# Patient Record
Sex: Male | Born: 1955 | Race: White | Hispanic: No | Marital: Married | Smoking: Never smoker
Health system: Southern US, Community
[De-identification: ages and names within clinical notes are randomized; demographics above are authoritative.]

## PROBLEM LIST (undated history)

## (undated) DIAGNOSIS — I10 Essential (primary) hypertension: Secondary | ICD-10-CM

## (undated) DIAGNOSIS — J45909 Unspecified asthma, uncomplicated: Secondary | ICD-10-CM

## (undated) DIAGNOSIS — E119 Type 2 diabetes mellitus without complications: Secondary | ICD-10-CM

## (undated) DIAGNOSIS — E559 Vitamin D deficiency, unspecified: Secondary | ICD-10-CM

## (undated) DIAGNOSIS — E785 Hyperlipidemia, unspecified: Secondary | ICD-10-CM

## (undated) HISTORY — DX: Vitamin D deficiency, unspecified: E55.9

## (undated) HISTORY — DX: Type 2 diabetes mellitus without complications: E11.9

## (undated) HISTORY — DX: Essential (primary) hypertension: I10

## (undated) HISTORY — DX: Hyperlipidemia, unspecified: E78.5

## (undated) HISTORY — DX: Unspecified asthma, uncomplicated: J45.909

---

## 2006-05-08 ENCOUNTER — Ambulatory Visit (HOSPITAL_COMMUNITY): Admission: RE | Admit: 2006-05-08 | Discharge: 2006-05-09 | Payer: Self-pay | Admitting: Orthopedic Surgery

## 2006-06-06 ENCOUNTER — Encounter: Admission: RE | Admit: 2006-06-06 | Discharge: 2006-09-04 | Payer: Self-pay | Admitting: Orthopedic Surgery

## 2006-09-05 ENCOUNTER — Encounter: Admission: RE | Admit: 2006-09-05 | Discharge: 2006-10-03 | Payer: Self-pay | Admitting: Orthopedic Surgery

## 2007-06-30 ENCOUNTER — Ambulatory Visit: Payer: Self-pay | Admitting: Internal Medicine

## 2007-07-14 ENCOUNTER — Ambulatory Visit: Payer: Self-pay | Admitting: Internal Medicine

## 2008-07-08 ENCOUNTER — Encounter (INDEPENDENT_AMBULATORY_CARE_PROVIDER_SITE_OTHER): Payer: Self-pay | Admitting: *Deleted

## 2008-08-31 ENCOUNTER — Encounter: Admission: RE | Admit: 2008-08-31 | Discharge: 2008-08-31 | Payer: Self-pay | Admitting: Neurosurgery

## 2009-04-11 ENCOUNTER — Ambulatory Visit (HOSPITAL_COMMUNITY): Admission: RE | Admit: 2009-04-11 | Discharge: 2009-04-11 | Payer: Self-pay | Admitting: Neurosurgery

## 2009-05-04 ENCOUNTER — Telehealth: Payer: Self-pay | Admitting: Internal Medicine

## 2009-06-02 ENCOUNTER — Encounter: Admission: RE | Admit: 2009-06-02 | Discharge: 2009-06-02 | Payer: Self-pay | Admitting: Neurosurgery

## 2009-08-15 ENCOUNTER — Encounter
Admission: RE | Admit: 2009-08-15 | Discharge: 2009-11-13 | Payer: Self-pay | Source: Home / Self Care | Admitting: Neurosurgery

## 2010-04-13 NOTE — Progress Notes (Signed)
Summary: Schedule Colonoscopy  Phone Note Outgoing Call Call back at [facility] Phone [phone]   Call placed by: Harlow Mares CMA Duncan Dull),  May 04, 2009 3:37 PM Call placed to: Patient Summary of Call: Left message on patients machine to call back.  Initial call taken by: Harlow Mares CMA Duncan Dull),  May 04, 2009 3:37 PM  Follow-up for Phone Call        patinet just has 5 disk repaired in his neck and he will call back when he is ready to schedule his colonoscopy Follow-up by: Harlow Mares CMA Duncan Dull),  May 17, 2009 4:10 PM

## 2010-05-29 LAB — GLUCOSE, CAPILLARY: Glucose-Capillary: 191 mg/dL — ABNORMAL HIGH (ref 70–99)

## 2010-05-29 LAB — BASIC METABOLIC PANEL
BUN: 13 mg/dL (ref 6–23)
Creatinine, Ser: 1.11 mg/dL (ref 0.4–1.5)
GFR calc non Af Amer: >60 mL/min (ref >60)
Glucose, Bld: 132 mg/dL — ABNORMAL HIGH (ref 70–99)
Potassium: 4.7 mEq/L (ref 3.5–5.1)

## 2010-05-29 LAB — CBC
Platelets: 221 10*3/uL (ref 150–400)
RDW: 12.9 % (ref 11.5–15.5)
WBC: 8.3 10*3/uL (ref 4.0–10.5)

## 2010-05-29 LAB — DIFFERENTIAL
Basophils Absolute: 0.1 10*3/uL (ref 0.0–0.1)
Basophils Relative: 1 % (ref 0–1)
Eosinophils Absolute: 0.1 10*3/uL (ref 0.0–0.7)
Eosinophils Relative: 2 % (ref 0–5)
Lymphocytes Relative: 32 % (ref 12–46)
Lymphs Abs: 2.7 10*3/uL (ref 0.7–4.0)
Monocytes Absolute: 0.6 10*3/uL (ref 0.1–1.0)

## 2010-06-15 ENCOUNTER — Encounter: Payer: Self-pay | Admitting: Nurse Practitioner

## 2010-06-15 DIAGNOSIS — J449 Chronic obstructive pulmonary disease, unspecified: Secondary | ICD-10-CM

## 2010-06-15 DIAGNOSIS — E119 Type 2 diabetes mellitus without complications: Secondary | ICD-10-CM | POA: Insufficient documentation

## 2010-06-15 DIAGNOSIS — I10 Essential (primary) hypertension: Secondary | ICD-10-CM | POA: Insufficient documentation

## 2010-06-15 DIAGNOSIS — E785 Hyperlipidemia, unspecified: Secondary | ICD-10-CM | POA: Insufficient documentation

## 2010-06-15 DIAGNOSIS — J4489 Other specified chronic obstructive pulmonary disease: Secondary | ICD-10-CM | POA: Insufficient documentation

## 2010-06-15 DIAGNOSIS — E559 Vitamin D deficiency, unspecified: Secondary | ICD-10-CM | POA: Insufficient documentation

## 2010-07-28 NOTE — Op Note (Signed)
NAMEDREXLER, MALAND                ACCOUNT NO.:  [id number]   MEDICAL RECORD NO.:  [id number]          PATIENT TYPE:  AMB   LOCATION:  DAY                          FACILITY:  [facility]   PHYSICIAN:  Ronald A. Gioffre, M.D.DATE OF BIRTH:  11-Jan-1956   DATE OF PROCEDURE:  05/08/2006  DATE OF DISCHARGE:                               OPERATIVE REPORT   SURGEON:  Georges Lynch. Darrelyn Hillock, M.D.   ASSISTANT:  Jamelle Rushing, P.A.   PREOPERATIVE DIAGNOSES:  1. Partial tear rotator cuff tendon right shoulder.  2. Severe impingement syndrome right shoulder.   POSTOPERATIVE DIAGNOSES:  1. Partial tear rotator cuff tendon right shoulder.  2. Severe impingement syndrome right shoulder.   OPERATION:  1. Acromionectomy acromioplasty right shoulder.  2. Repair of the tear of the rotator cuff tendon utilizing the Restore      tendon graft, no Mitek anchors were necessary.   PROCEDURE:  Under general anesthesia, routine orthopedic prep and  draping of the right shoulder was carried out.  Prior to general  anesthetic, he had an interscalene nerve block.  He also had 1 gram of  IV Ancef.  Following that, an incision was made over the anterior aspect  of the right shoulder.  Bleeders identified and cauterized.  I then  separated the deltoid tendon from the acromion in the usual fashion.  He  had an extremely large overhanging type acromion.  I protected the  underlying rotator cuff with a Bennett retractor and then, utilizing the  oscillating saw, did an acromionectomy, then utilized a bur to even out  the undersurface of the acromion.  I thoroughly irrigated out the area.  I inspected the articular surface of the clavicle and the undersurface  the clavicle.  There was no impingement.  No reason to do any excision  at this time of the distal clavicle.  I then examined the tendon.  He  had a very small tear of the tendon.  Tendon was quite thinned out  distally so I elected at this time to reinforce the  tendon repair with a  Restore tendon graft utilizing #1 Ethibond suture.  Then thoroughly  irrigated out the area, inserted some thrombin-soaked Gelfoam in the  subacromial space for coagulation purposes.  I then reapproximated the  deltoid tendon and muscle in the usual fashion with #1 Ethibond and then  closed the main part of the muscle with 0 Vicryl, subcu was  closed 0  Vicryl and skin was closed with metal staples.  Sterile Neosporin  dressing was applied.  He was placed in a shoulder immobilizer.  Postoperatively, he will see me in the office in about approximately 2  weeks, will remove his staples and started him on some gradual motion at  that time.  No x-rays will be necessary.           ______________________________  Georges Lynch Darrelyn Hillock, M.D.    RAG/MEDQ  D:  05/08/2006  T:  05/08/2006  Job:  [id number]

## 2011-04-22 IMAGING — CR DG CHEST 2V
2 series · 2 of 2 positions shown · non-contrast
Comparison: None

CLINICAL DATA: Pre admit for neck surgery

CHEST - 2 VIEW

[view not recorded (1 of 2)]
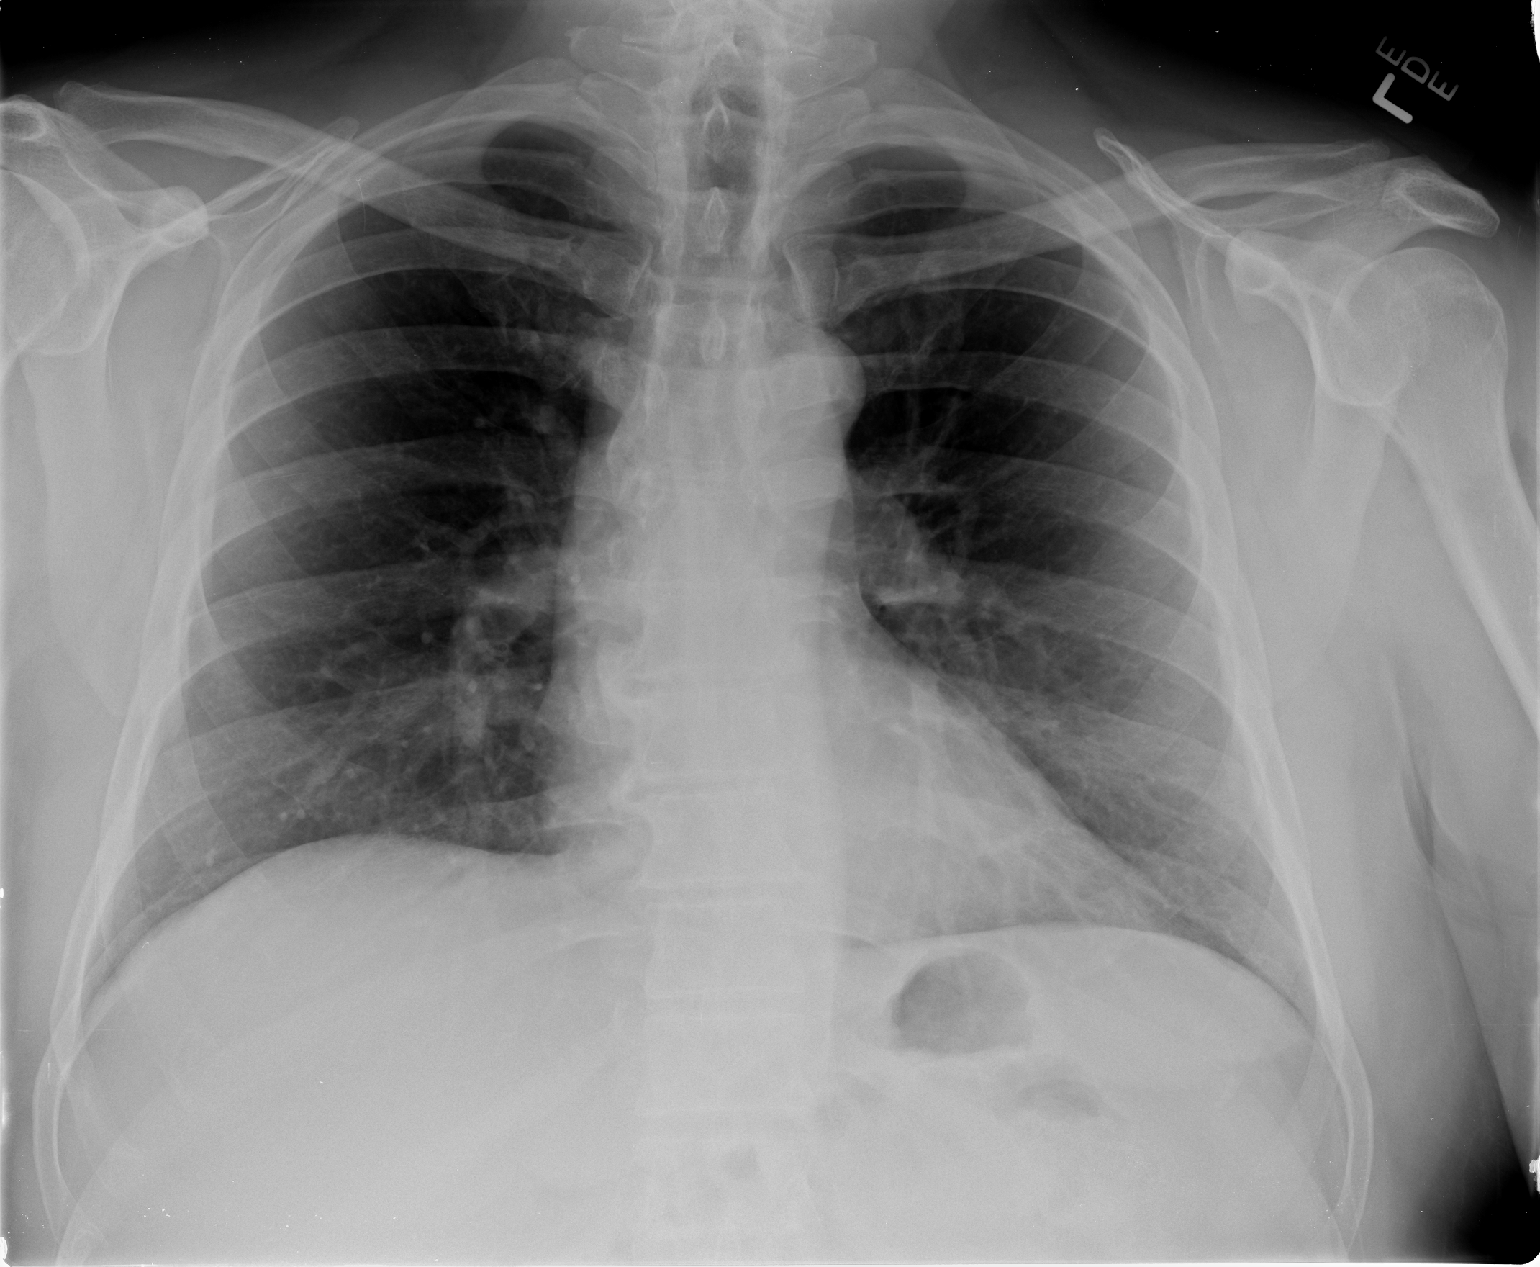

[view not recorded (2 of 2)]
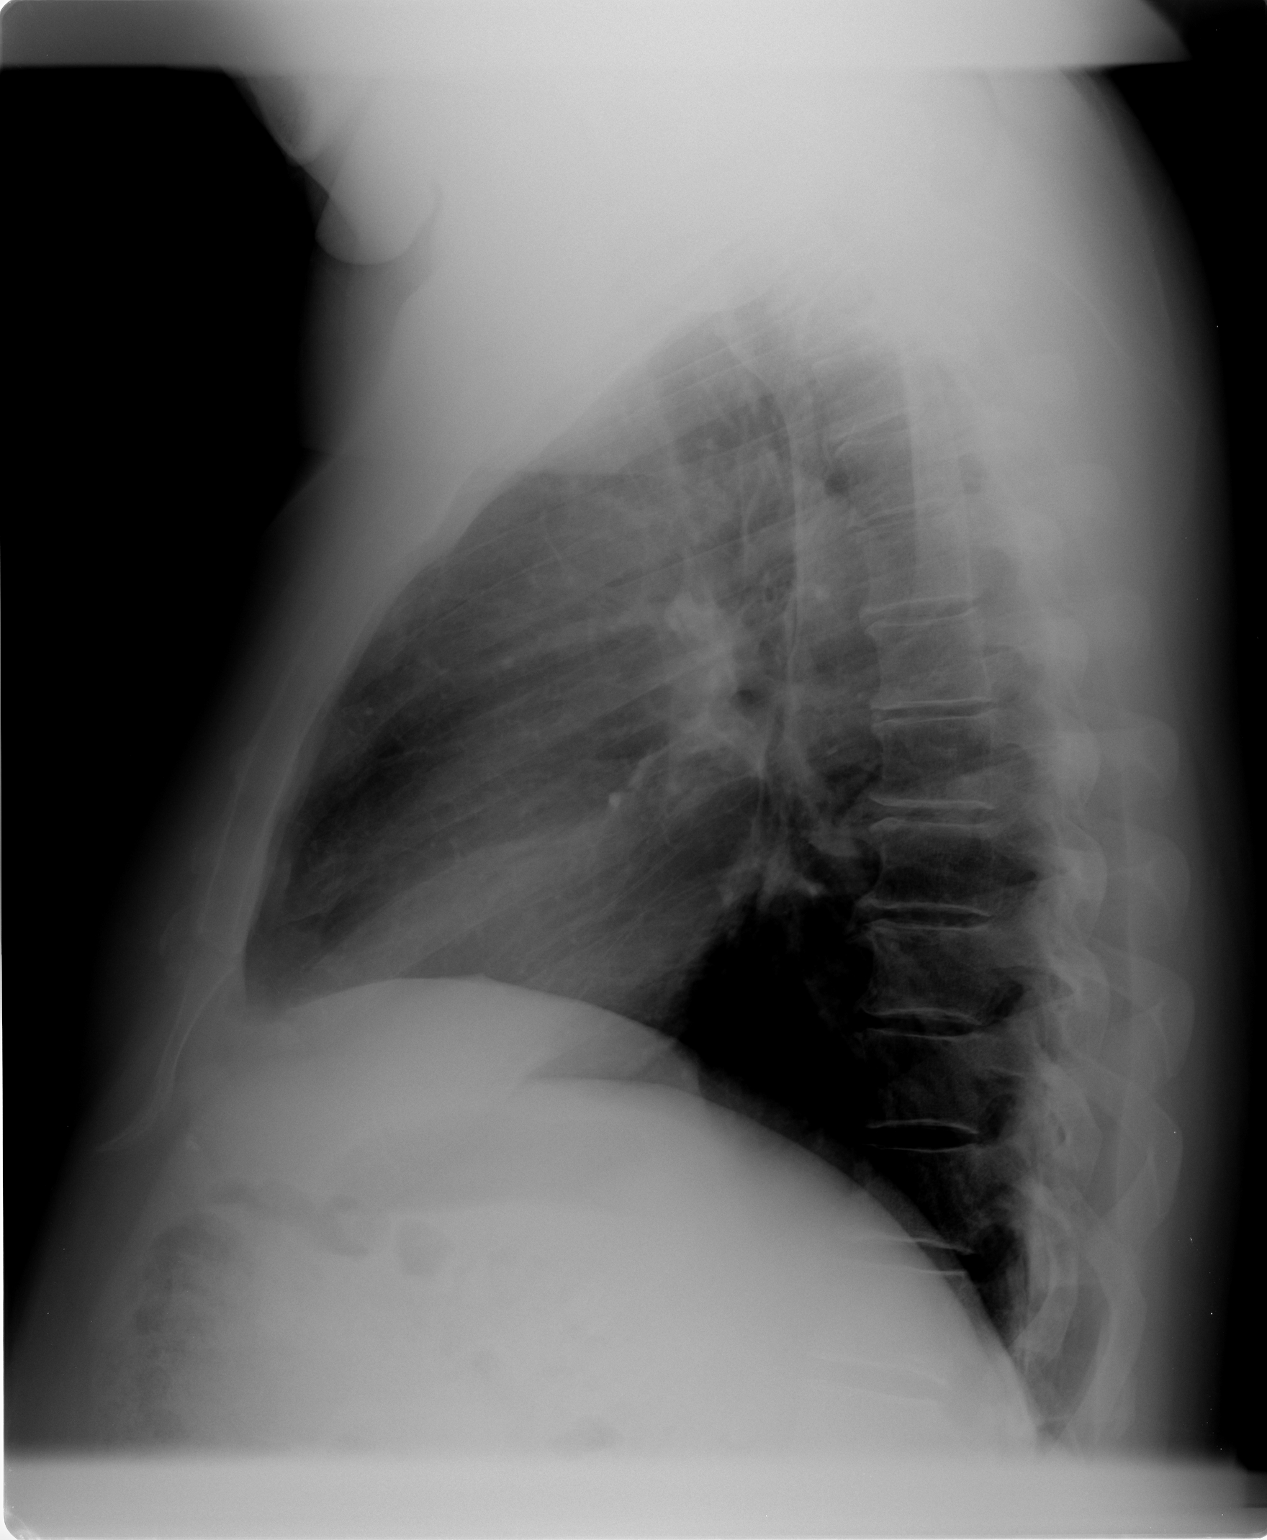

[2 of 2 positions shown; findings below may reference images not displayed]

FINDINGS: Heart and mediastinal contours normal.  Lungs essentially
clear.  There appear to be several small right lower lobe calcified
granulomata.  No acute findings.  Osseous structures intact.
IMPRESSION: Scattered right lower lobe calcified granulomata - no active
disease.

## 2011-06-16 IMAGING — CR DG CERVICAL SPINE 2 OR 3 VIEWS
3 series · 3 of 3 positions shown · non-contrast
Comparison: Intraoperative C-arm spot films of the cervical spine
film 04/11/2009

CLINICAL DATA: Right-sided neck and shoulder pain, history of
anterior fusion at C6-7 on 04/11/2009

CERVICAL SPINE - 2-3 VIEW

[w c-spine lat (1 of 3)]
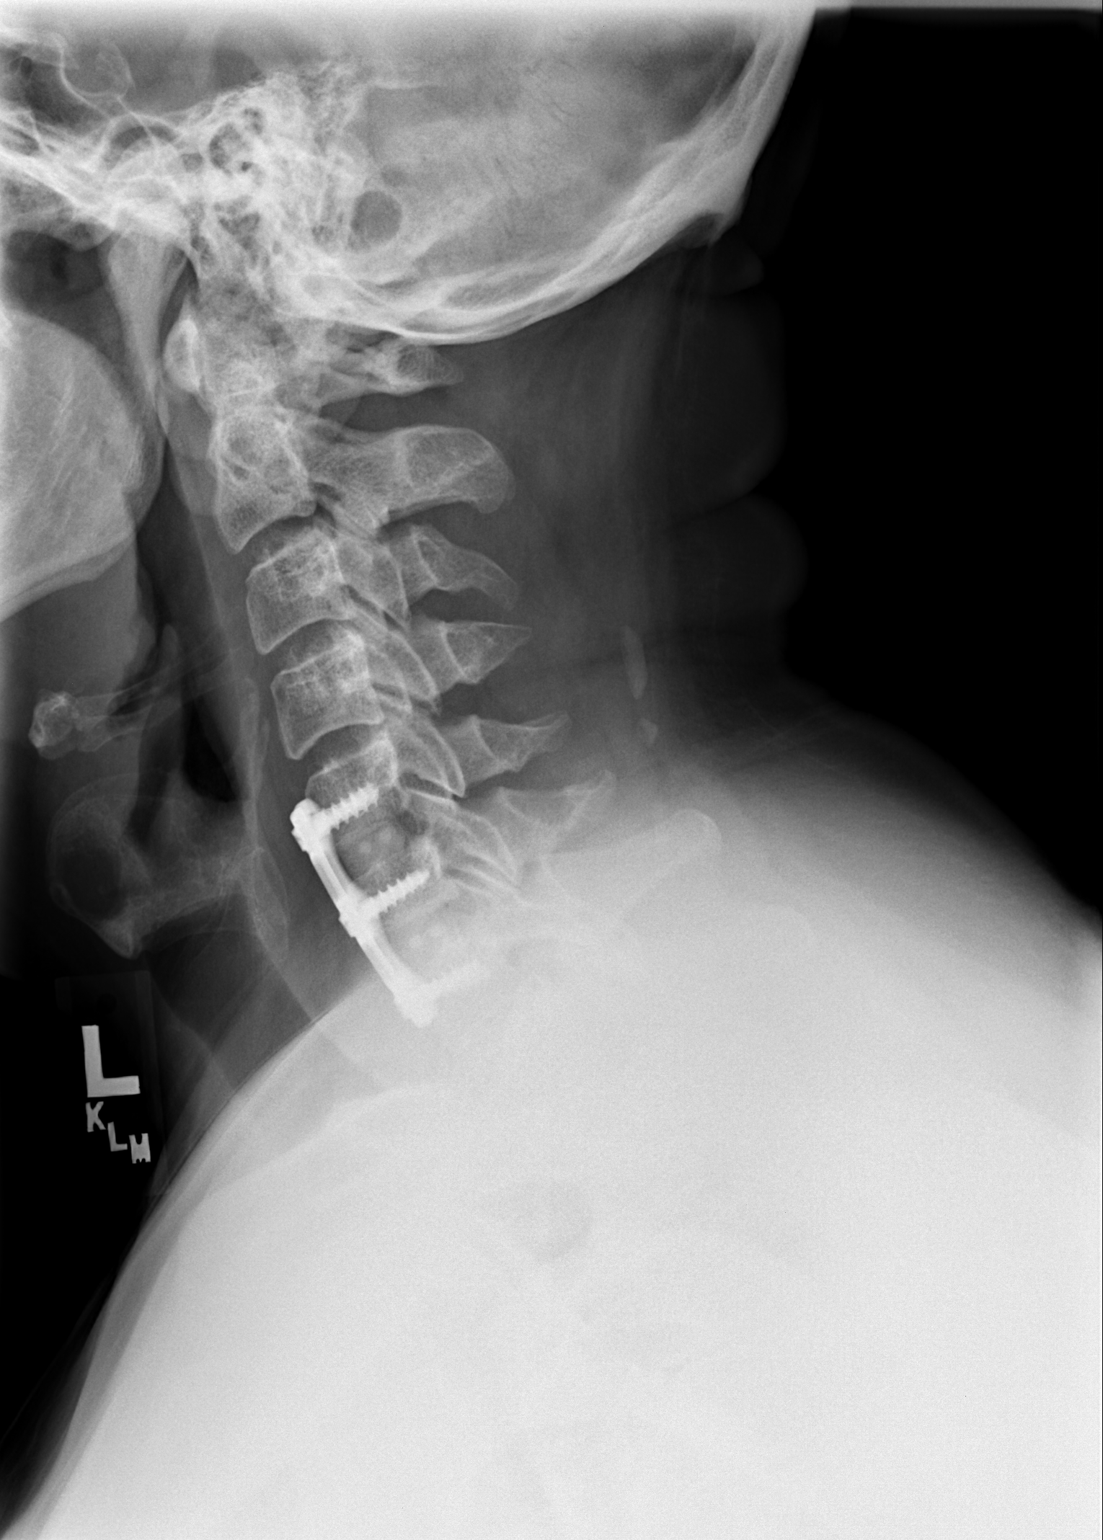

[w c-spine lat (2 of 3)]
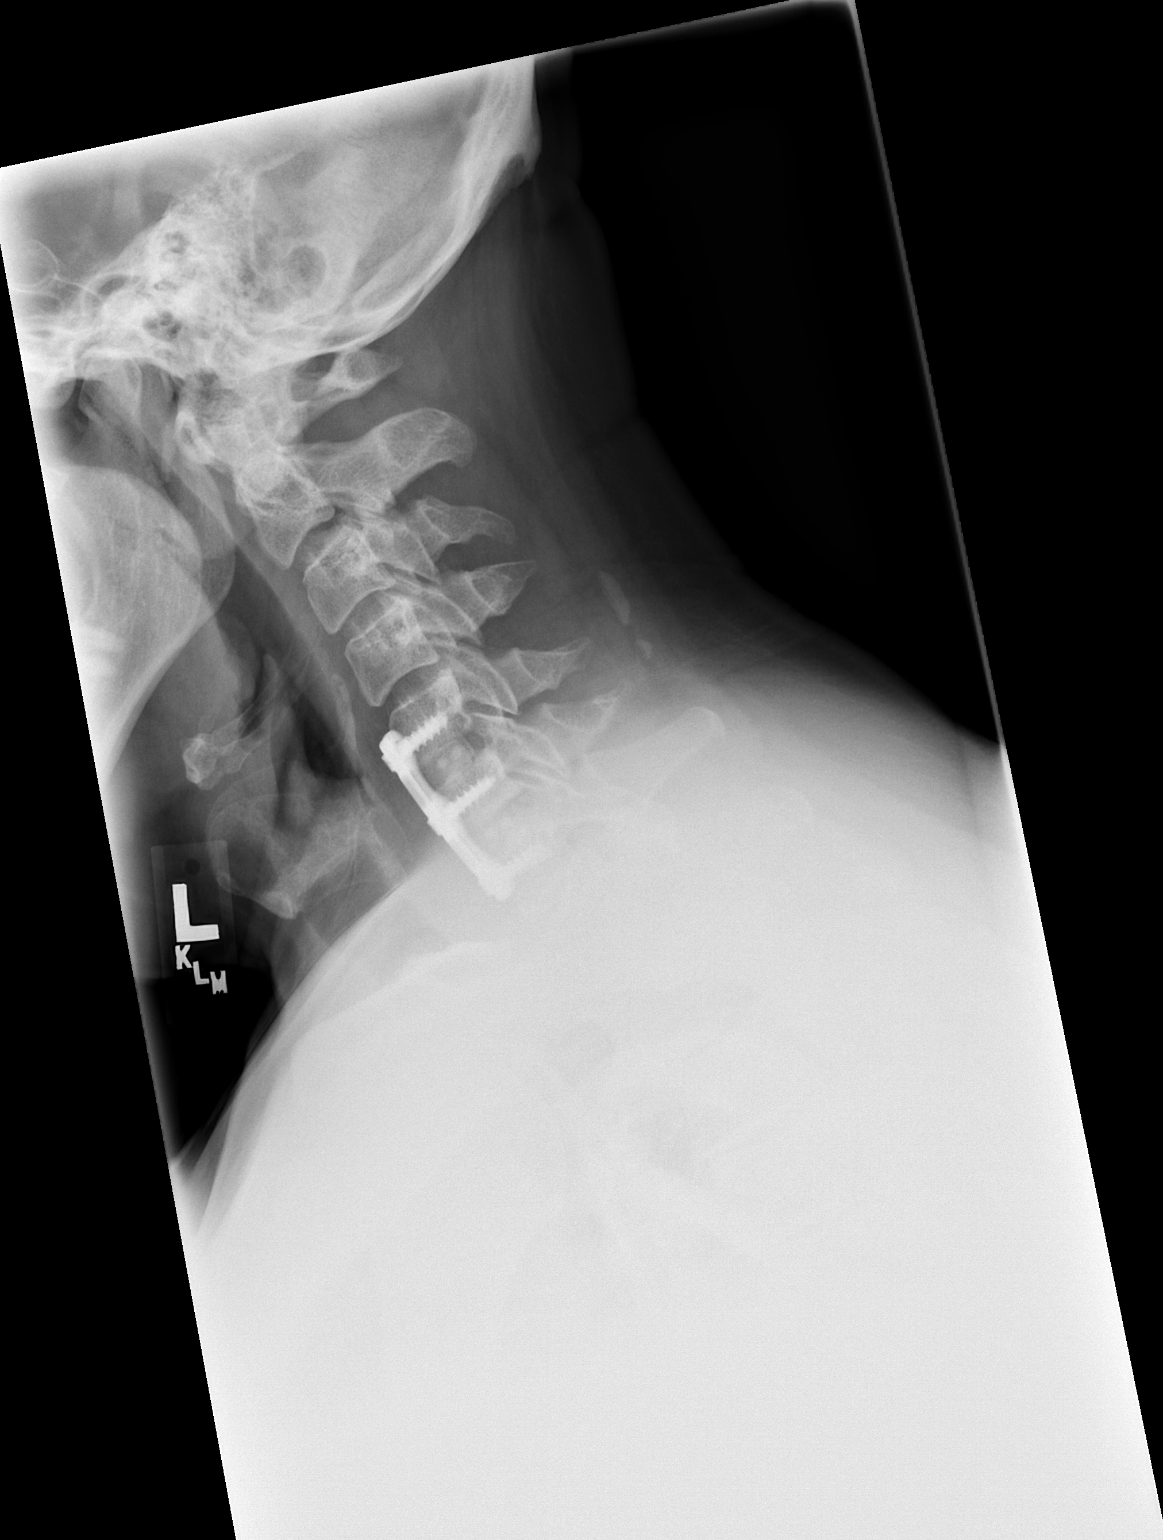

[w c-spine lat (3 of 3)]
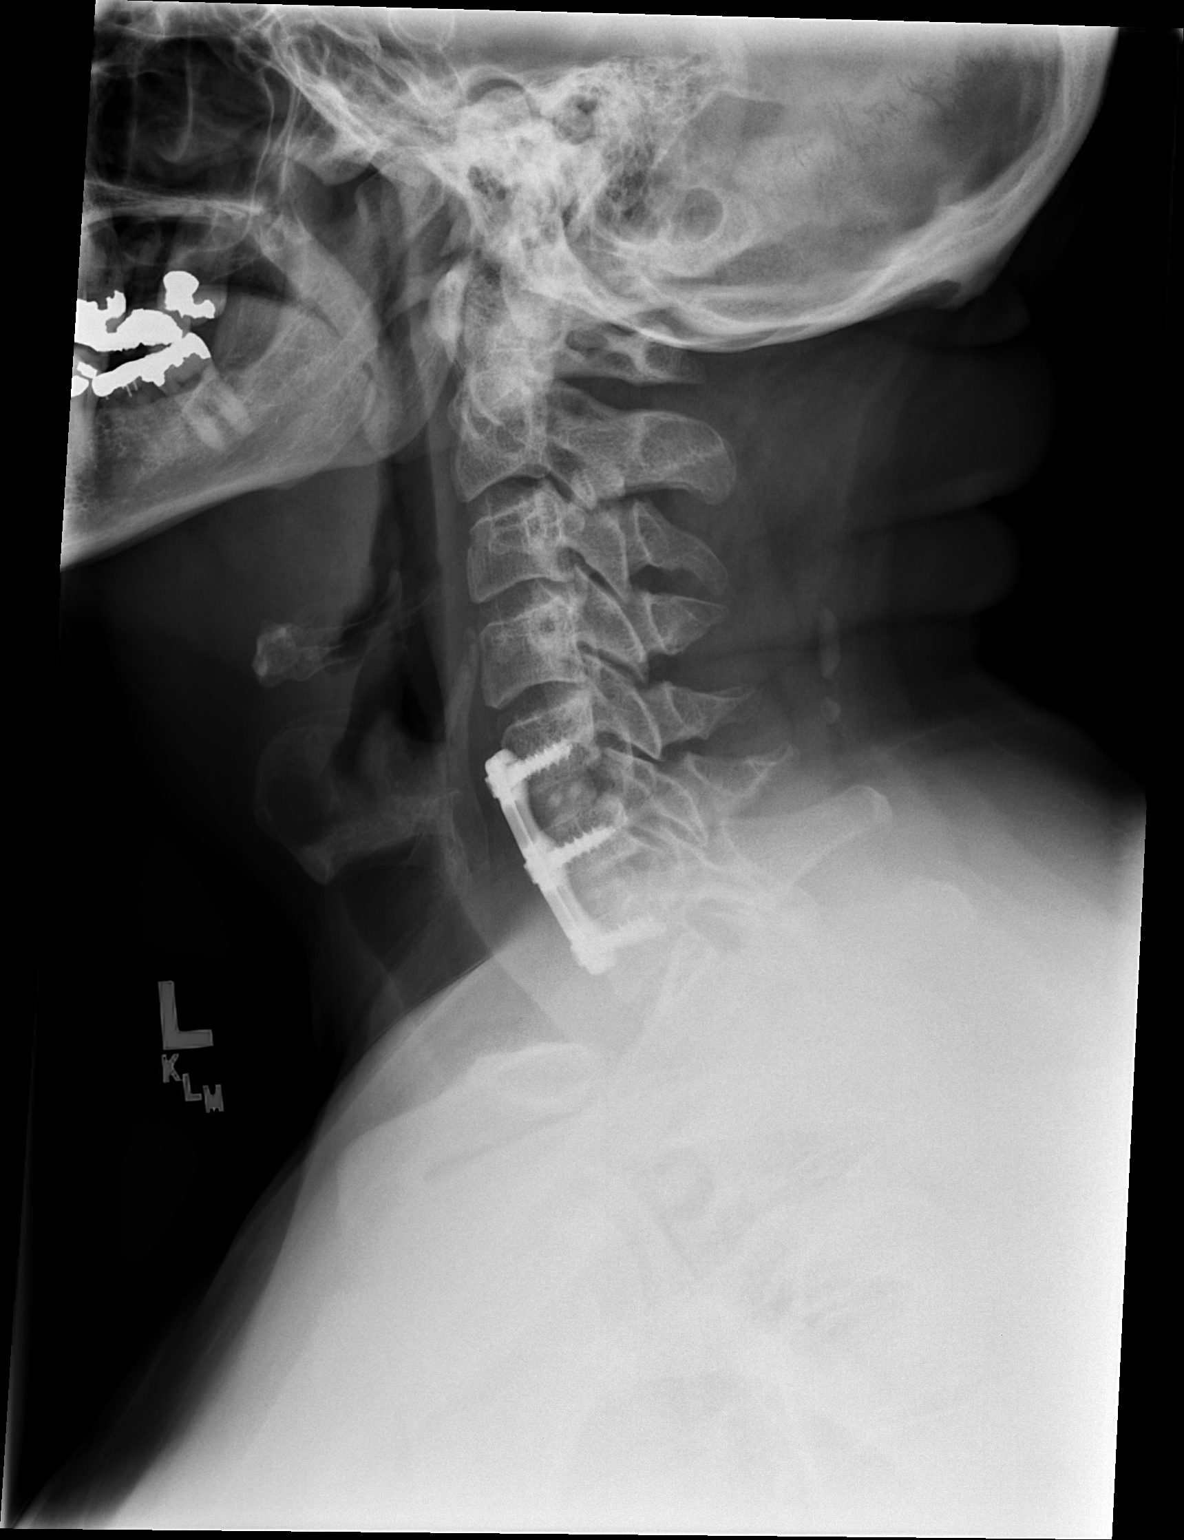

[3 of 3 positions shown; findings below may reference images not displayed]

FINDINGS: The cervical vertebrae are in normal alignment.  Anterior
fusion from C5-C7 appears stable with normal alignment.  Interbody
fusion plugs at C5-6 and C6-7 are in good position and normal in
height.  No prevertebral soft tissue swelling is seen.  Through
flexion and extension there is a very limited range of motion.
IMPRESSION: Normal alignment.   Stable anterior fusion at C6-7.  Limited range
of motion through flexion and extension.

## 2012-07-02 ENCOUNTER — Other Ambulatory Visit: Payer: Self-pay

## 2012-07-02 MED ORDER — METFORMIN HCL 1000 MG PO TABS: 1000.0000 mg | ORAL_TABLET | Freq: Two times a day (BID) | ORAL | Status: DC

## 2012-07-02 MED ORDER — LISINOPRIL 20 MG PO TABS: 20.0000 mg | ORAL_TABLET | Freq: Every day | ORAL | Status: DC

## 2012-07-16 ENCOUNTER — Other Ambulatory Visit: Payer: Self-pay | Admitting: *Deleted

## 2012-07-16 MED ORDER — AMLODIPINE BESYLATE 5 MG PO TABS: 5.0000 mg | ORAL_TABLET | Freq: Every day | ORAL | Status: DC

## 2012-07-21 ENCOUNTER — Ambulatory Visit (INDEPENDENT_AMBULATORY_CARE_PROVIDER_SITE_OTHER): Payer: Medicare Other | Admitting: Family Medicine

## 2012-07-21 ENCOUNTER — Encounter: Payer: Self-pay | Admitting: Family Medicine

## 2012-07-21 VITALS — BP 129/80 | HR 85 | Temp 97.7°F | Ht 68.0 in | Wt 223.4 lb

## 2012-07-21 DIAGNOSIS — E119 Type 2 diabetes mellitus without complications: Secondary | ICD-10-CM

## 2012-07-21 DIAGNOSIS — E785 Hyperlipidemia, unspecified: Secondary | ICD-10-CM

## 2012-07-21 DIAGNOSIS — J4489 Other specified chronic obstructive pulmonary disease: Secondary | ICD-10-CM

## 2012-07-21 DIAGNOSIS — I10 Essential (primary) hypertension: Secondary | ICD-10-CM

## 2012-07-21 DIAGNOSIS — J449 Chronic obstructive pulmonary disease, unspecified: Secondary | ICD-10-CM

## 2012-07-21 DIAGNOSIS — E559 Vitamin D deficiency, unspecified: Secondary | ICD-10-CM

## 2012-07-21 LAB — HEPATIC FUNCTION PANEL
ALT: 54 U/L — ABNORMAL HIGH (ref 0–53)
AST: 40 U/L — ABNORMAL HIGH (ref 0–37)
Albumin: 4.3 g/dL (ref 3.5–5.2)
Alkaline Phosphatase: 63 U/L (ref 39–117)
Bilirubin, Direct: 0.1 mg/dL (ref 0.0–0.3)
Indirect Bilirubin: 0.4 mg/dL (ref 0.0–0.9)
Total Bilirubin: 0.5 mg/dL (ref 0.3–1.2)
Total Protein: 7.5 g/dL (ref 6.0–8.3)

## 2012-07-21 LAB — BASIC METABOLIC PANEL WITH GFR
BUN: 19 mg/dL (ref 6–23)
CO2: 23 mEq/L (ref 19–32)
Calcium: 9.8 mg/dL (ref 8.4–10.5)
Chloride: 99 mEq/L (ref 96–112)
Creat: 0.93 mg/dL (ref 0.50–1.35)
GFR, Est African American: >89 mL/min
GFR, Est Non African American: >89 mL/min
Glucose, Bld: 175 mg/dL — ABNORMAL HIGH (ref 70–99)
Potassium: 5.1 mEq/L (ref 3.5–5.3)
Sodium: 134 mEq/L — ABNORMAL LOW (ref 135–145)

## 2012-07-21 LAB — POCT GLYCOSYLATED HEMOGLOBIN (HGB A1C): Hemoglobin A1C: 7.9

## 2012-07-21 LAB — POCT UA - MICROALBUMIN: Microalbumin Ur, POC: NEGATIVE mg/dL

## 2012-07-21 MED ORDER — LISINOPRIL 20 MG PO TABS: 20.0000 mg | ORAL_TABLET | Freq: Every day | ORAL | Status: DC

## 2012-07-21 MED ORDER — METFORMIN HCL 1000 MG PO TABS: 1000.0000 mg | ORAL_TABLET | Freq: Two times a day (BID) | ORAL | Status: DC

## 2012-07-21 MED ORDER — ALBUTEROL SULFATE HFA 108 (90 BASE) MCG/ACT IN AERS: 2.0000 | INHALATION_SPRAY | Freq: Four times a day (QID) | RESPIRATORY_TRACT | Status: AC | PRN

## 2012-07-21 MED ORDER — BUDESONIDE-FORMOTEROL FUMARATE 160-4.5 MCG/ACT IN AERO: 2.0000 | INHALATION_SPRAY | Freq: Two times a day (BID) | RESPIRATORY_TRACT | Status: AC

## 2012-07-21 MED ORDER — AMLODIPINE BESYLATE 5 MG PO TABS: 5.0000 mg | ORAL_TABLET | Freq: Every day | ORAL | Status: DC

## 2012-07-21 MED ORDER — ROSUVASTATIN CALCIUM 5 MG PO TABS: 5.0000 mg | ORAL_TABLET | Freq: Every day | ORAL | Status: AC

## 2012-07-21 NOTE — Progress Notes (Signed)
Patient ID: Karl Barry, male   DOB: 07-30-1955, 57 y.o.   MRN: [id number] SUBJECTIVE: HPI: Patient is here for follow up of Diabetes Mellitus/Hypertension/hyperlipidemia:  .Symptoms of DM:has had Nocturia once in a while and usually not bad only on ,deniesUrinary Frequency ,denies Blurred vision ,deniesDizziness,denies.Dysuria,deniesparesthesias, deniesextremity pain or ulcers.Marland Kitchendenieschest pain. .has hadan annual eye exam. March 2014. Was Okay  do check the feet. doescheck CBGs. Average CBG:_150-167_______.Marland Kitchen deniesto episodes of hypoglycemia. doeshave an emergency hypoglycemic plan. admits toCompliance with medications. deniesProblems with medications.   PMH/PSH: reviewed/updated in Epic  SH/FH: reviewed/updated in Epic  Allergies: reviewed/updated in Epic  Medications: reviewed/updated in Epic  Immunizations: reviewed/updated in Epic  ROS: As above in the HPI. All other systems are stable or negative.  OBJECTIVE: APPEARANCE:  Patient in no acute distress.The patient appeared well nourished and normally developed. Acyanotic. Waist:42 3/4 inches VITAL SIGNS:BP 129/80  Pulse 85  Temp(Src) 97.7 F (36.5 C) (Oral)  Ht 5\' 8"  (1.727 m)  Wt 223 lb 6.4 oz (101.334 kg)  BMI 33.98 kg/m2 Central Obesity, WM  SKIN: warm and  Dry without overt rashes, tattoos and scars  HEAD and Neck: without JVD, Head and scalp: normal Eyes:No scleral icterus. Fundi normal, eye movements normal. Ears: Auricle normal, canal normal, Tympanic membranes normal, insufflation normal. Nose: normal Throat: normal Neck & thyroid: normal  CHEST & LUNGS: Chest wall: normal Lungs: Clear  CVS: Reveals the PMI to be normally located. Regular rhythm, First and Second Heart sounds are normal,  absence of murmurs, rubs or gallops. Peripheral vasculature: Radial pulses: normal Dorsal pedis pulses: normal Posterior pulses: normal  ABDOMEN:  Appearance: normal Benign,, no organomegaly, no  masses, no Abdominal Aortic enlargement. No Guarding , no rebound. No Bruits. Bowel sounds: normal  RECTAL: N/A GU: N/A  EXTREMETIES: nonedematous. Both Femoral and Pedal pulses are normal.  MUSCULOSKELETAL:  Spine: normal Joints: intact  NEUROLOGIC: oriented to time,place and person; nonfocal. Strength is normal Sensory is normal Reflexes are normal Cranial Nerves are normal.   ASSESSMENT: Type 2 diabetes mellitus - Plan: POCT glycosylated hemoglobin (Hb A1C), POCT UA - Microalbumin, metFORMIN (GLUCOPHAGE) 1000 MG tablet  Hyperlipidemia - Plan: Hepatic function panel, NMR Lipoprofile with Lipids, rosuvastatin (CRESTOR) 5 MG tablet  HTN (hypertension) - Plan: BASIC METABOLIC PANEL WITH GFR, lisinopril (PRINIVIL,ZESTRIL) 20 MG tablet, amLODipine (NORVASC) 5 MG tablet  Vitamin D deficiency  Asthmatic bronchitis , chronic - Plan: budesonide-formoterol (SYMBICORT) 160-4.5 MCG/ACT inhaler, albuterol (PROVENTIL HFA;VENTOLIN HFA) 108 (90 BASE) MCG/ACT inhaler    PLAN: Orders Placed This Encounter  Procedures  . BASIC METABOLIC PANEL WITH GFR  . Hepatic function panel  . NMR Lipoprofile with Lipids  . POCT glycosylated hemoglobin (Hb A1C)  . POCT UA - Microalbumin   Results for orders placed in visit on 07/21/12  POCT UA - MICROALBUMIN      Result Value Range   Microalbumin Ur, POC neg     Meds ordered this encounter  Medications  . metFORMIN (GLUCOPHAGE) 1000 MG tablet    Sig: Take 1 tablet (1,000 mg total) by mouth 2 (two) times daily. 1/2 tab q am 1 tab q pm    Dispense:  60 tablet    Refill:  5  . lisinopril (PRINIVIL,ZESTRIL) 20 MG tablet    Sig: Take 1 tablet (20 mg total) by mouth daily.    Dispense:  30 tablet    Refill:  5  . amLODipine (NORVASC) 5 MG tablet    Sig: Take 1 tablet (5  mg total) by mouth daily.    Dispense:  30 tablet    Refill:  5  . rosuvastatin (CRESTOR) 5 MG tablet    Sig: Take 1 tablet (5 mg total) by mouth daily.    Dispense:  30  tablet    Refill:  5  . budesonide-formoterol (SYMBICORT) 160-4.5 MCG/ACT inhaler    Sig: Inhale 2 puffs into the lungs 2 (two) times daily.    Dispense:  1 Inhaler    Refill:  5  . albuterol (PROVENTIL HFA;VENTOLIN HFA) 108 (90 BASE) MCG/ACT inhaler    Sig: Inhale 2 puffs into the lungs every 6 (six) hours as needed for wheezing.    Dispense:  1 Inhaler    Refill:  1   Samples of symbicort given x 2 of the lower dose at this time, and 5 weeks samples of Crestor 5 mg given.       Dr Woodroe Mode Recommendations  Diet and Exercise discussed with patient.  For nutrition information, I recommend books:  1).Eat to Live by Dr Monico Hoar. 2).Prevent and Reverse Heart Disease by Dr Suzzette Righter.  Exercise recommendations are:  If unable to walk, then the patient can exercise in a chair 3 times a day. By flapping arms like a bird gently and raising legs outwards to the front.  If ambulatory, the patient can go for walks for 30 minutes 3 times a week. Then increase the intensity and duration as tolerated.  Goal is to try to attain exercise frequency to 5 times a week.  If applicable: Best to perform resistance exercises (machines or weights) 2 days a week and cardio type exercises 3 days per week.  RTC  3 months.  Natacia Chaisson P. Modesto Charon, M.D.

## 2012-07-21 NOTE — Patient Instructions (Addendum)
      Dr Loreen Bankson's Recommendations  Diet and Exercise discussed with patient.  For nutrition information, I recommend books:  1).Eat to Live by Dr Joel Fuhrman. 2).Prevent and Reverse Heart Disease by Dr Caldwell Esselstyn.  Exercise recommendations are:  If unable to walk, then the patient can exercise in a chair 3 times a day. By flapping arms like a bird gently and raising legs outwards to the front.  If ambulatory, the patient can go for walks for 30 minutes 3 times a week. Then increase the intensity and duration as tolerated.  Goal is to try to attain exercise frequency to 5 times a week.  If applicable: Best to perform resistance exercises (machines or weights) 2 days a week and cardio type exercises 3 days per week.  

## 2012-07-22 ENCOUNTER — Telehealth: Payer: Self-pay | Admitting: *Deleted

## 2012-07-22 LAB — NMR LIPOPROFILE WITH LIPIDS
Cholesterol, Total: 116 mg/dL (ref <200)
HDL Particle Number: 31.7 umol/L (ref >=30.5)
HDL Size: 9.3 nm (ref >=9.2)
HDL-C: 34 mg/dL — ABNORMAL LOW (ref >=40)
LDL (calc): 42 mg/dL (ref <100)
LDL Particle Number: 847 nmol/L (ref <1000)
LDL Size: 19.8 nm — ABNORMAL LOW (ref >20.5)
LP-IR Score: 74 — ABNORMAL HIGH (ref <=45)
Large HDL-P: 3.7 umol/L — ABNORMAL LOW (ref >=4.8)
Large VLDL-P: 11.9 nmol/L — ABNORMAL HIGH (ref <=2.7)
Small LDL Particle Number: 661 nmol/L — ABNORMAL HIGH (ref <=527)
Triglycerides: 199 mg/dL — ABNORMAL HIGH (ref <150)
VLDL Size: 61.3 nm — ABNORMAL HIGH (ref <=46.6)

## 2012-07-22 NOTE — Telephone Encounter (Signed)
[facility] [place] notified and rx clarified.

## 2012-07-22 NOTE — Telephone Encounter (Signed)
Pharmacy faxed questioning directions on metformin. On rx sent it reads: Take 1 tablet (1000mg  total) by mouth 2 (two times daily). 1/2 tabl qam and 1 tab q pm. Are these the correct directions. If so please have nurse contact [facility] in [place] to clarify. Thank you

## 2012-07-22 NOTE — Telephone Encounter (Signed)
One twice  A day. I think there was an old edited Sig that attached to the Rx. Thanks. FW

## 2012-07-24 NOTE — Progress Notes (Signed)
Done

## 2012-08-14 ENCOUNTER — Encounter: Payer: Self-pay | Admitting: Pharmacist

## 2012-08-14 ENCOUNTER — Ambulatory Visit (INDEPENDENT_AMBULATORY_CARE_PROVIDER_SITE_OTHER): Payer: Medicare Other | Admitting: Pharmacist

## 2012-08-14 VITALS — BP 114/80 | HR 76 | Ht 68.0 in | Wt 222.5 lb

## 2012-08-14 DIAGNOSIS — E119 Type 2 diabetes mellitus without complications: Secondary | ICD-10-CM

## 2012-08-14 DIAGNOSIS — E785 Hyperlipidemia, unspecified: Secondary | ICD-10-CM

## 2012-08-14 MED ORDER — LINAGLIPTIN-METFORMIN HCL 2.5-1000 MG PO TABS: 1.0000 | ORAL_TABLET | Freq: Two times a day (BID) | ORAL | Status: DC

## 2012-08-14 NOTE — Progress Notes (Signed)
Diabetes Flow Sheet:  Visit 1  Chief Complaint:   Chief Complaint  Patient presents with  . Diabetes    HPI:  Patient with type 2 diabetes.  His A1c was 6.9% in February of this year but he was having difficulty tolerating Januvia - GI discomfort.  In February 2014 Januvia was discontinued and pioglitizone 15mg  daily was started but he only got 1 time (did not get refilled).  Patient continued metformin 1000mg  bid at that time.  GI problems have resolved but HBG readings and A1c have increased.  Currently A1C on 07/21/12 was 7.9% Patient has seen Chari Manning, clinical pharmacist several years ago and education about CHO counting was given but patient admits to not following ADA diet recently although he remembers concepts.  Patient's wife if present in office with him today.  Exam  Edema:  negative  Polyuria:  negaive  Polydipsia:  negatvie Polyphagia:  negative  BMI:  Body mass index is 33.84 kg/(m^2).    General Appearance:  obese Mood/Affect:  normal  Low fat/carbohydrate diet?  No Nicotine Abuse?  No Medication Compliance?  Yes Exercise?  No Alcohol Abuse?  No  Glucose Readings - per patient he checks BG twice a day Highest recent reading - 251 (post prandial) Lowest recent reading - 119   Lab Results  Component Value Date   HGBA1C 7.9 % 07/21/2012    Lab Results  Component Value Date   LDLCALC 42 07/21/2012   TRIG 199* 07/21/2012     Medication Checklist: ACE Inhibitor/ARB?  Yes Lipid Lowering Agent?  Yes Aspirin?  Yes Oral Hypoglycemic Agent(s)?  Yes  Assessment: 1.  type 2 Diabetes.  Worsening control with current therapy 2.  Blood Pressure Control.  Controlled with current therapy 3.  Lipid Control.  Elevated Tg and low HDL worsened by #1.  LDL at goal  Recommendations: 1.  1800 calorie, carbohydrate counting diet.  Patient is counseled extensively on carbohydrate counting, serving sizes, saturated fat intake and  meal planning.  Patient is instructed to  eat 3 meals a day and 3 small snacks.  Patient will supplement snacks based on physical activity. 2.  30 minutes of physical activity.  Patient is counseled to always carry glucose tablets, lifesavers, hard candies, etc., while exercising in case of  hypoglycemic event. 3.  Patient is counseled on pathophysiology of diabetes and the risk of long-term complications.  Fasting blood glucose goals are 80-120mg /dL.   Post-prandial goals are < 140.  A1C goals < 6.5%. 4.  LDL goal of < 100, HDL > 40 and TG < 150; BP goal < 130/80 5.  Patient is counseled on proper use of glucometer and lancing device.  Patient is informed how often to test and how to respond to unsuitable  results. 6.  Medication recommendations at this time are as follows:  Discontinue pioglitazone and metformin  StartJentadueto (linaglipton/metformin) 2.5/1000mg  1 tablet bid.     Time spent counseling patient:  40   Referring provider:  Modesto Charon   PharmD:  Henrene Pastor, [facility]

## 2012-10-01 ENCOUNTER — Other Ambulatory Visit: Payer: Self-pay

## 2012-10-01 DIAGNOSIS — I10 Essential (primary) hypertension: Secondary | ICD-10-CM

## 2012-10-01 MED ORDER — AMLODIPINE BESYLATE 5 MG PO TABS: 5.0000 mg | ORAL_TABLET | Freq: Every day | ORAL | Status: DC

## 2012-10-02 ENCOUNTER — Telehealth: Payer: Self-pay | Admitting: Family Medicine

## 2012-10-03 ENCOUNTER — Other Ambulatory Visit: Payer: Self-pay | Admitting: *Deleted

## 2012-10-03 DIAGNOSIS — I10 Essential (primary) hypertension: Secondary | ICD-10-CM

## 2012-10-03 MED ORDER — AMLODIPINE BESYLATE 5 MG PO TABS: 5.0000 mg | ORAL_TABLET | Freq: Every day | ORAL | Status: AC

## 2012-10-03 MED ORDER — LINAGLIPTIN-METFORMIN HCL 2.5-1000 MG PO TABS: 1.0000 | ORAL_TABLET | Freq: Two times a day (BID) | ORAL | Status: DC

## 2012-10-03 MED ORDER — LISINOPRIL 20 MG PO TABS: 20.0000 mg | ORAL_TABLET | Freq: Every day | ORAL | Status: DC

## 2012-10-03 NOTE — Telephone Encounter (Signed)
He should be on the combination medication.

## 2012-10-03 NOTE — Telephone Encounter (Signed)
Received request from pharmacy for a 90 day supply for metformin hcl 1000mg . (1 tablet twice a day). According to med list he is on linagliptin-metformin 2.07-998. Which should patient be taking. Please advise

## 2012-10-03 NOTE — Telephone Encounter (Signed)
done

## 2012-10-23 ENCOUNTER — Encounter: Payer: Self-pay | Admitting: Family Medicine

## 2012-10-23 ENCOUNTER — Ambulatory Visit (INDEPENDENT_AMBULATORY_CARE_PROVIDER_SITE_OTHER): Payer: Medicare Other | Admitting: Family Medicine

## 2012-10-23 VITALS — BP 123/81 | HR 88 | Temp 98.1°F | Ht 68.0 in | Wt 222.6 lb

## 2012-10-23 DIAGNOSIS — J449 Chronic obstructive pulmonary disease, unspecified: Secondary | ICD-10-CM

## 2012-10-23 DIAGNOSIS — E785 Hyperlipidemia, unspecified: Secondary | ICD-10-CM

## 2012-10-23 DIAGNOSIS — I10 Essential (primary) hypertension: Secondary | ICD-10-CM

## 2012-10-23 DIAGNOSIS — E119 Type 2 diabetes mellitus without complications: Secondary | ICD-10-CM

## 2012-10-23 DIAGNOSIS — J4489 Other specified chronic obstructive pulmonary disease: Secondary | ICD-10-CM

## 2012-10-23 DIAGNOSIS — E559 Vitamin D deficiency, unspecified: Secondary | ICD-10-CM

## 2012-10-23 LAB — POCT GLYCOSYLATED HEMOGLOBIN (HGB A1C): Hemoglobin A1C: 8

## 2012-10-23 MED ORDER — METFORMIN HCL 1000 MG PO TABS: 1500.0000 mg | ORAL_TABLET | Freq: Every day | ORAL | Status: DC

## 2012-10-23 NOTE — Patient Instructions (Addendum)
      Dr Ashlyn Cabler's Recommendations  Diet and Exercise discussed with patient.  For nutrition information, I recommend books:  1).Eat to Live by Dr Joel Fuhrman. 2).Prevent and Reverse Heart Disease by Dr Caldwell Esselstyn. 3) Dr Neal Barnard's Book:  Program to Reverse Diabetes  Exercise recommendations are:  If unable to walk, then the patient can exercise in a chair 3 times a day. By flapping arms like a bird gently and raising legs outwards to the front.  If ambulatory, the patient can go for walks for 30 minutes 3 times a week. Then increase the intensity and duration as tolerated.  Goal is to try to attain exercise frequency to 5 times a week.  If applicable: Best to perform resistance exercises (machines or weights) 2 days a week and cardio type exercises 3 days per week.  

## 2012-10-23 NOTE — Progress Notes (Signed)
Patient ID: Karl Barry, male   DOB: 28-Nov-1955, 57 y.o.   MRN: [id number] SUBJECTIVE: CC: Chief Complaint  Patient presents with  . Follow-up    3 month follow up    HPI: Patient is here for follow up of Diabetes Mellitus/HTN/HLD: Symptoms evaluated: Denies Nocturia ,Denies Urinary Frequency , denies Blurred vision ,deniesDizziness,denies.Dysuria,denies paresthesias, denies extremity pain or ulcers.Marland Kitchendenies chest pain. has had an annual eye exam. do check the feet. Does check CBGs. Average CBG:130s Denies episodes of hypoglycemia. Does have an emergency hypoglycemic plan. admits toCompliance with medications. Denies Problems with medications.  Past Medical History  Diagnosis Date  . Hyperlipidemia   . Diabetes mellitus without complication   . Hypertension   . Asthma   . Vitamin D deficiency    History reviewed. No pertinent past surgical history. History   Social History  . Marital Status: Married    Spouse Name: N/A    Number of Children: N/A  . Years of Education: N/A   Occupational History  . Not on file.   Social History Main Topics  . Smoking status: Never Smoker   . Smokeless tobacco: Not on file  . Alcohol Use: No  . Drug Use: No  . Sexual Activity: Not on file   Other Topics Concern  . Not on file   Social History Narrative  . No narrative on file   Family History  Problem Relation Age of Onset  . Stroke Mother   . Diabetes Mother   . Leukemia Father   . Diabetes Sister   . Diabetes Brother    Current Outpatient Prescriptions on File Prior to Visit  Medication Sig Dispense Refill  . albuterol (PROVENTIL HFA;VENTOLIN HFA) 108 (90 BASE) MCG/ACT inhaler Inhale 2 puffs into the lungs every 6 (six) hours as needed for wheezing.  1 Inhaler  1  . amLODipine (NORVASC) 5 MG tablet Take 1 tablet (5 mg total) by mouth daily.  90 tablet  1  . aspirin 81 MG EC tablet Take 81 mg by mouth daily.        . budesonide-formoterol (SYMBICORT) 160-4.5 MCG/ACT  inhaler Inhale 2 puffs into the lungs 2 (two) times daily.  1 Inhaler  5  . Cholecalciferol (VITAMIN D) 1000 UNITS capsule Take 1,000 Units by mouth daily.        Marland Kitchen lisinopril (PRINIVIL,ZESTRIL) 20 MG tablet Take 1 tablet (20 mg total) by mouth daily.  90 tablet  1  . rosuvastatin (CRESTOR) 5 MG tablet Take 1 tablet (5 mg total) by mouth daily.  30 tablet  5   No current facility-administered medications on file prior to visit.   Allergies  Allergen Reactions  . Ibuprofen Nausea And Vomiting  . Januvia [Sitagliptin]     There is no immunization history on file for this patient. Prior to Admission medications   Medication Sig Start Date End Date Taking? Authorizing Provider  albuterol (PROVENTIL HFA;VENTOLIN HFA) 108 (90 BASE) MCG/ACT inhaler Inhale 2 puffs into the lungs every 6 (six) hours as needed for wheezing. 07/21/12  Yes Ileana Ladd, MD  amLODipine (NORVASC) 5 MG tablet Take 1 tablet (5 mg total) by mouth daily. 10/03/12  Yes Ileana Ladd, MD  aspirin 81 MG EC tablet Take 81 mg by mouth daily.     Yes Historical Provider, MD  budesonide-formoterol (SYMBICORT) 160-4.5 MCG/ACT inhaler Inhale 2 puffs into the lungs 2 (two) times daily. 07/21/12  Yes Ileana Ladd, MD  Cholecalciferol (VITAMIN D) 1000 UNITS  capsule Take 1,000 Units by mouth daily.     Yes Historical Provider, MD  lisinopril (PRINIVIL,ZESTRIL) 20 MG tablet Take 1 tablet (20 mg total) by mouth daily. 10/03/12  Yes Ileana Ladd, MD  metFORMIN (GLUCOPHAGE) 1000 MG tablet Take 1.5 tablets (1,500 mg total) by mouth daily with breakfast. Or 1 tablet in morning and 1/2 tablet in the evening. 10/23/12  Yes Ileana Ladd, MD  rosuvastatin (CRESTOR) 5 MG tablet Take 1 tablet (5 mg total) by mouth daily. 07/21/12  Yes Ileana Ladd, MD    ROS: As above in the HPI. All other systems are stable or negative.  OBJECTIVE: APPEARANCE:  Patient in no acute distress.The patient appeared well nourished and normally developed.  Acyanotic. Waist:40.5 inches VITAL SIGNS:BP 123/81  Pulse 88  Temp(Src) 98.1 F (36.7 C) (Oral)  Ht 5\' 8"  (1.727 m)  Wt 222 lb 9.6 oz (100.971 kg)  BMI 33.85 kg/m2 WM  SKIN: warm and  Dry without overt rashes, tattoos and scars  HEAD and Neck: without JVD, Head and scalp: normal Eyes:No scleral icterus. Fundi normal, eye movements normal. Ears: Auricle normal, canal normal, Tympanic membranes normal, insufflation normal. Nose: normal Throat: normal Neck & thyroid: normal  CHEST & LUNGS: Chest wall: normal Lungs: Clear  CVS: Reveals the PMI to be normally located. Regular rhythm, First and Second Heart sounds are normal,  absence of murmurs, rubs or gallops. Peripheral vasculature: Radial pulses: normal Dorsal pedis pulses: normal Posterior pulses: normal  ABDOMEN:  Appearance: obese Benign, no organomegaly, no masses, no Abdominal Aortic enlargement. No Guarding , no rebound. No Bruits. Bowel sounds: normal  RECTAL: N/A GU: N/A  EXTREMETIES: nonedematous. Both Femoral and Pedal pulses are normal.  MUSCULOSKELETAL:  Spine: normal Joints: intact  NEUROLOGIC: oriented to time,place and person; nonfocal. Strength is normal Sensory is normal Reflexes are normal Cranial Nerves are normal.   Results for orders placed in visit on 10/23/12  POCT GLYCOSYLATED HEMOGLOBIN (HGB A1C)      Result Value Range   Hemoglobin A1C 8.0 %      ASSESSMENT: Asthmatic bronchitis , chronic  Type 2 diabetes mellitus - Plan: CMP14+EGFR, POCT glycosylated hemoglobin (Hb A1C), metFORMIN (GLUCOPHAGE) 1000 MG tablet, CANCELED: POCT UA - Microalbumin  Hyperlipidemia - Plan: CMP14+EGFR, NMR, lipoprofile  HTN (hypertension)  Vitamin D deficiency - Plan: Vitamin D 25 hydroxy  PLAN: Orders Placed This Encounter  Procedures  . CMP14+EGFR  . NMR, lipoprofile  . Vitamin D 25 hydroxy  . POCT glycosylated hemoglobin (Hb A1C)    Meds ordered this encounter  Medications  .  DISCONTD: metFORMIN (GLUCOPHAGE) 1000 MG tablet    Sig: Take 1,000 mg by mouth 2 (two) times daily with a meal. 1 tablet in morning and 1/2 tablet in the evening.  . metFORMIN (GLUCOPHAGE) 1000 MG tablet    Sig: Take 1.5 tablets (1,500 mg total) by mouth daily with breakfast. Or 1 tablet in morning and 1/2 tablet in the evening.    Dispense:  60 tablet    Refill:  11         Dr Woodroe Mode Recommendations  Diet and Exercise discussed with patient.  For nutrition information, I recommend books:  1).Eat to Live by Dr Monico Hoar. 2).Prevent and Reverse Heart Disease by Dr Suzzette Righter. 3) Dr Katherina Right Book:  Program to Reverse Diabetes  Exercise recommendations are:  If unable to walk, then the patient can exercise in a chair 3 times a day. By flapping  arms like a bird gently and raising legs outwards to the front.  If ambulatory, the patient can go for walks for 30 minutes 3 times a week. Then increase the intensity and duration as tolerated.  Goal is to try to attain exercise frequency to 5 times a week.  If applicable: Best to perform resistance exercises (machines or weights) 2 days a week and cardio type exercises 3 days per week.  Return in about 3 months (around 01/23/2013) for Recheck medical problems.  Carolle Ishii P. Modesto Charon, M.D.

## 2012-10-25 LAB — CMP14+EGFR
ALT: 70 IU/L — ABNORMAL HIGH (ref 0–44)
AST: 43 IU/L — ABNORMAL HIGH (ref 0–40)
Albumin/Globulin Ratio: 2 (ref 1.1–2.5)
Albumin: 4.8 g/dL (ref 3.5–5.5)
Alkaline Phosphatase: 60 IU/L (ref 39–117)
BUN/Creatinine Ratio: 16 (ref 9–20)
BUN: 16 mg/dL (ref 6–24)
CO2: 24 mmol/L (ref 18–29)
Calcium: 9.8 mg/dL (ref 8.7–10.2)
Chloride: 96 mmol/L — ABNORMAL LOW (ref 97–108)
Creatinine, Ser: 0.99 mg/dL (ref 0.76–1.27)
GFR calc Af Amer: 96 mL/min/{1.73_m2} (ref >59)
GFR calc non Af Amer: 83 mL/min/{1.73_m2} (ref >59)
Globulin, Total: 2.4 g/dL (ref 1.5–4.5)
Glucose: 156 mg/dL — ABNORMAL HIGH (ref 65–99)
Potassium: 4.8 mmol/L (ref 3.5–5.2)
Sodium: 138 mmol/L (ref 134–144)
Total Bilirubin: 0.3 mg/dL (ref 0.0–1.2)
Total Protein: 7.2 g/dL (ref 6.0–8.5)

## 2012-10-25 LAB — NMR, LIPOPROFILE
Cholesterol: 101 mg/dL (ref <200)
HDL Cholesterol by NMR: 31 mg/dL — ABNORMAL LOW (ref >=40)
HDL Particle Number: 31.2 umol/L (ref >=30.5)
LDL Particle Number: 986 nmol/L (ref <1000)
LDL Size: 19.6 nm — ABNORMAL LOW (ref >20.5)
LDLC SERPL CALC-MCNC: 31 mg/dL (ref <100)
LP-IR Score: 84 — ABNORMAL HIGH (ref <=45)
Small LDL Particle Number: 930 nmol/L — ABNORMAL HIGH (ref <=527)
Triglycerides by NMR: 197 mg/dL — ABNORMAL HIGH (ref <150)

## 2012-10-25 LAB — VITAMIN D 25 HYDROXY (VIT D DEFICIENCY, FRACTURES): Vit D, 25-Hydroxy: 38.5 ng/mL (ref 30.0–100.0)

## 2012-10-27 ENCOUNTER — Telehealth: Payer: Self-pay | Admitting: Family Medicine

## 2012-10-27 NOTE — Telephone Encounter (Signed)
Patient notified of lab results. See lab note 

## 2012-11-25 ENCOUNTER — Encounter: Payer: Self-pay | Admitting: *Deleted

## 2012-12-18 ENCOUNTER — Ambulatory Visit (INDEPENDENT_AMBULATORY_CARE_PROVIDER_SITE_OTHER): Payer: Medicare Other | Admitting: Family Medicine

## 2012-12-18 ENCOUNTER — Encounter: Payer: Self-pay | Admitting: Family Medicine

## 2012-12-18 ENCOUNTER — Telehealth: Payer: Self-pay | Admitting: Family Medicine

## 2012-12-18 VITALS — BP 130/82 | HR 92 | Temp 98.9°F | Ht 68.0 in | Wt 221.2 lb

## 2012-12-18 DIAGNOSIS — J069 Acute upper respiratory infection, unspecified: Secondary | ICD-10-CM | POA: Insufficient documentation

## 2012-12-18 DIAGNOSIS — B349 Viral infection, unspecified: Secondary | ICD-10-CM | POA: Insufficient documentation

## 2012-12-18 DIAGNOSIS — B9789 Other viral agents as the cause of diseases classified elsewhere: Secondary | ICD-10-CM

## 2012-12-18 DIAGNOSIS — J029 Acute pharyngitis, unspecified: Secondary | ICD-10-CM | POA: Insufficient documentation

## 2012-12-18 LAB — POCT RAPID STREP A (OFFICE): Rapid Strep A Screen: NEGATIVE

## 2012-12-18 NOTE — Progress Notes (Signed)
Patient ID: Karl Barry, male   DOB: 08/20/1955, 57 y.o.   MRN: [id number] SUBJECTIVE: CC: Chief Complaint  Patient presents with  . Sore Throat    started yesterday  . Cough  . Headache    HPI: As above. No stiif neck. No fever. Was barefooted ona cold floor and shouted at his wife and later started with sore throat and cough and headache and mild runny nose.  He is DM and CBGs running 120 to 150.    Past Medical History  Diagnosis Date  . Hyperlipidemia   . Diabetes mellitus without complication   . Hypertension   . Asthma   . Vitamin D deficiency    History reviewed. No pertinent past surgical history. History   Social History  . Marital Status: Married    Spouse Name: N/A    Number of Children: N/A  . Years of Education: N/A   Occupational History  . Not on file.   Social History Main Topics  . Smoking status: Never Smoker   . Smokeless tobacco: Not on file  . Alcohol Use: No  . Drug Use: No  . Sexual Activity: Not on file   Other Topics Concern  . Not on file   Social History Narrative  . No narrative on file   Family History  Problem Relation Age of Onset  . Stroke Mother   . Diabetes Mother   . Leukemia Father   . Diabetes Sister   . Diabetes Brother    Current Outpatient Prescriptions on File Prior to Visit  Medication Sig Dispense Refill  . albuterol (PROVENTIL HFA;VENTOLIN HFA) 108 (90 BASE) MCG/ACT inhaler Inhale 2 puffs into the lungs every 6 (six) hours as needed for wheezing.  1 Inhaler  1  . amLODipine (NORVASC) 5 MG tablet Take 1 tablet (5 mg total) by mouth daily.  90 tablet  1  . aspirin 81 MG EC tablet Take 81 mg by mouth daily.        . budesonide-formoterol (SYMBICORT) 160-4.5 MCG/ACT inhaler Inhale 2 puffs into the lungs 2 (two) times daily.  1 Inhaler  5  . Cholecalciferol (VITAMIN D) 1000 UNITS capsule Take 1,000 Units by mouth daily.        Marland Kitchen lisinopril (PRINIVIL,ZESTRIL) 20 MG tablet Take 1 tablet (20 mg total) by mouth  daily.  90 tablet  1  . metFORMIN (GLUCOPHAGE) 1000 MG tablet Take 1.5 tablets (1,500 mg total) by mouth daily with breakfast. Or 1 tablet in morning and 1/2 tablet in the evening.  60 tablet  11  . rosuvastatin (CRESTOR) 5 MG tablet Take 1 tablet (5 mg total) by mouth daily.  30 tablet  5   No current facility-administered medications on file prior to visit.   Allergies  Allergen Reactions  . Ibuprofen Nausea And Vomiting  . Januvia [Sitagliptin]     There is no immunization history on file for this patient. Prior to Admission medications   Medication Sig Start Date End Date Taking? Authorizing Provider  albuterol (PROVENTIL HFA;VENTOLIN HFA) 108 (90 BASE) MCG/ACT inhaler Inhale 2 puffs into the lungs every 6 (six) hours as needed for wheezing. 07/21/12  Yes Ileana Ladd, MD  amLODipine (NORVASC) 5 MG tablet Take 1 tablet (5 mg total) by mouth daily. 10/03/12  Yes Ileana Ladd, MD  aspirin 81 MG EC tablet Take 81 mg by mouth daily.     Yes Historical Provider, MD  budesonide-formoterol (SYMBICORT) 160-4.5 MCG/ACT inhaler Inhale 2 puffs  into the lungs 2 (two) times daily. 07/21/12  Yes Ileana Ladd, MD  Cholecalciferol (VITAMIN D) 1000 UNITS capsule Take 1,000 Units by mouth daily.     Yes Historical Provider, MD  lisinopril (PRINIVIL,ZESTRIL) 20 MG tablet Take 1 tablet (20 mg total) by mouth daily. 10/03/12  Yes Ileana Ladd, MD  metFORMIN (GLUCOPHAGE) 1000 MG tablet Take 1.5 tablets (1,500 mg total) by mouth daily with breakfast. Or 1 tablet in morning and 1/2 tablet in the evening. 10/23/12  Yes Ileana Ladd, MD  rosuvastatin (CRESTOR) 5 MG tablet Take 1 tablet (5 mg total) by mouth daily. 07/21/12  Yes Ileana Ladd, MD     ROS: As above in the HPI. All other systems are stable or negative.  OBJECTIVE: APPEARANCE:  Patient in no acute distress.The patient appeared well nourished and normally developed. Acyanotic. Waist: VITAL SIGNS:BP 130/82  Pulse 92  Temp(Src) 98.9 F  (37.2 C) (Oral)  Ht 5\' 8"  (1.727 m)  Wt 221 lb 3.2 oz (100.336 kg)  BMI 33.64 kg/m2  WM  SKIN: warm and  Dry without overt rashes, tattoos and scars  HEAD and Neck: without JVD, Head and scalp: normal Eyes:No scleral icterus. Fundi normal, eye movements normal. Ears: Auricle normal, canal normal, Tympanic membranes normal, insufflation normal. Nose: normal Throat: red, no exudates Neck & thyroid: normal  CHEST & LUNGS: Chest wall: normal Lungs: Clear, no Rhonchi , no wheezes.  CVS: Reveals the PMI to be normally located. Regular rhythm, First and Second Heart sounds are normal,  absence of murmurs, rubs or gallops. Peripheral vasculature: Radial pulses: normal Dorsal pedis pulses: normal Posterior pulses: normal  ABDOMEN:  Appearance: obese Benign, no organomegaly, no masses, no Abdominal Aortic enlargement. No Guarding , no rebound. No Bruits. Bowel sounds: normal  RECTAL: N/A GU: N/A  EXTREMETIES: nonedematous.  NEUROLOGIC: oriented to time,place and person; nonfocal.  ASSESSMENT: Sore throat - Plan: POCT rapid strep A  Viral syndrome  Acute upper respiratory infections of unspecified site   PLAN: Orders Placed This Encounter  Procedures  . POCT rapid strep A    Results for orders placed in visit on 12/18/12  POCT RAPID STREP A (OFFICE)      Result Value Range   Rapid Strep A Screen Negative  Negative   Conservative measures. Lozenges Saline gargles Observe. Discussed with patient and his wife that this is most likely viral.  Return if symptoms worsen or fail to improve.  Kaleigha Chamberlin P. [place] Charon, M.D.

## 2012-12-18 NOTE — Telephone Encounter (Signed)
Pt wants to be seen for sore throat Appt scheduled

## 2012-12-19 ENCOUNTER — Ambulatory Visit: Payer: Medicare Other | Admitting: Family Medicine

## 2013-01-26 ENCOUNTER — Ambulatory Visit: Payer: Medicare Other | Admitting: Family Medicine

## 2013-02-02 ENCOUNTER — Ambulatory Visit: Payer: Medicare Other | Admitting: Family Medicine

## 2013-06-27 ENCOUNTER — Other Ambulatory Visit: Payer: Self-pay | Admitting: Family Medicine

## 2013-08-23 ENCOUNTER — Other Ambulatory Visit: Payer: Self-pay | Admitting: Family Medicine

## 2013-09-02 ENCOUNTER — Other Ambulatory Visit: Payer: Self-pay | Admitting: Family Medicine

## 2013-09-03 ENCOUNTER — Other Ambulatory Visit: Payer: Self-pay | Admitting: Family Medicine

## 2013-09-03 NOTE — Telephone Encounter (Signed)
Patient NTBS for follow up and lab work  

## 2013-09-03 NOTE — Telephone Encounter (Signed)
Last seen 10/29/12.

## 2013-09-21 ENCOUNTER — Other Ambulatory Visit: Payer: Self-pay | Admitting: *Deleted

## 2013-09-21 MED ORDER — LISINOPRIL 20 MG PO TABS: 20.0000 mg | ORAL_TABLET | Freq: Every day | ORAL | Status: DC

## 2013-09-21 NOTE — Telephone Encounter (Signed)
Patient last seen in office on 01-18-13. Please advise on refill

## 2013-09-21 NOTE — Telephone Encounter (Signed)
No  More refills without being seen 

## 2013-10-20 ENCOUNTER — Other Ambulatory Visit: Payer: Self-pay | Admitting: *Deleted

## 2013-10-20 MED ORDER — METFORMIN HCL 1000 MG PO TABS: 1500.0000 mg | ORAL_TABLET | Freq: Every day | ORAL | Status: AC

## 2013-10-20 NOTE — Telephone Encounter (Signed)
Pt of Dr. [place] CharonWong. [facility] and note says take Bid but last refill request has different directions. Pt ntbs. Last ov 8/14.

## 2013-10-20 NOTE — Telephone Encounter (Signed)
no more refills without being seen  

## 2013-11-30 ENCOUNTER — Other Ambulatory Visit: Payer: Self-pay | Admitting: Nurse Practitioner

## 2013-12-22 ENCOUNTER — Other Ambulatory Visit: Payer: Self-pay | Admitting: Nurse Practitioner

## 2014-03-19 ENCOUNTER — Other Ambulatory Visit: Payer: Self-pay | Admitting: Family Medicine

## 2014-10-19 DIAGNOSIS — J45909 Unspecified asthma, uncomplicated: Secondary | ICD-10-CM | POA: Diagnosis not present

## 2014-10-19 DIAGNOSIS — E785 Hyperlipidemia, unspecified: Secondary | ICD-10-CM | POA: Diagnosis not present

## 2014-10-19 DIAGNOSIS — I10 Essential (primary) hypertension: Secondary | ICD-10-CM | POA: Diagnosis not present

## 2014-10-19 DIAGNOSIS — E119 Type 2 diabetes mellitus without complications: Secondary | ICD-10-CM | POA: Diagnosis not present

## 2014-10-20 DIAGNOSIS — E119 Type 2 diabetes mellitus without complications: Secondary | ICD-10-CM | POA: Diagnosis not present

## 2014-10-20 DIAGNOSIS — E785 Hyperlipidemia, unspecified: Secondary | ICD-10-CM | POA: Diagnosis not present

## 2014-10-20 DIAGNOSIS — Z79899 Other long term (current) drug therapy: Secondary | ICD-10-CM | POA: Diagnosis not present

## 2014-10-20 DIAGNOSIS — I10 Essential (primary) hypertension: Secondary | ICD-10-CM | POA: Diagnosis not present

## 2014-10-20 DIAGNOSIS — Z125 Encounter for screening for malignant neoplasm of prostate: Secondary | ICD-10-CM | POA: Diagnosis not present

## 2015-01-20 DIAGNOSIS — I10 Essential (primary) hypertension: Secondary | ICD-10-CM | POA: Diagnosis not present

## 2015-01-20 DIAGNOSIS — E119 Type 2 diabetes mellitus without complications: Secondary | ICD-10-CM | POA: Diagnosis not present

## 2015-01-20 DIAGNOSIS — J45909 Unspecified asthma, uncomplicated: Secondary | ICD-10-CM | POA: Diagnosis not present

## 2015-01-20 DIAGNOSIS — E785 Hyperlipidemia, unspecified: Secondary | ICD-10-CM | POA: Diagnosis not present

## 2015-01-20 DIAGNOSIS — M779 Enthesopathy, unspecified: Secondary | ICD-10-CM | POA: Diagnosis not present

## 2015-01-20 DIAGNOSIS — Z23 Encounter for immunization: Secondary | ICD-10-CM | POA: Diagnosis not present

## 2015-02-18 ENCOUNTER — Telehealth: Payer: Self-pay | Admitting: General Practice

## 2015-03-21 ENCOUNTER — Encounter: Payer: Self-pay | Admitting: Internal Medicine

## 2015-04-12 DIAGNOSIS — J029 Acute pharyngitis, unspecified: Secondary | ICD-10-CM | POA: Diagnosis not present

## 2015-04-25 DIAGNOSIS — J45909 Unspecified asthma, uncomplicated: Secondary | ICD-10-CM | POA: Diagnosis not present

## 2015-04-25 DIAGNOSIS — I10 Essential (primary) hypertension: Secondary | ICD-10-CM | POA: Diagnosis not present

## 2015-04-25 DIAGNOSIS — Z79899 Other long term (current) drug therapy: Secondary | ICD-10-CM | POA: Diagnosis not present

## 2015-04-25 DIAGNOSIS — E119 Type 2 diabetes mellitus without complications: Secondary | ICD-10-CM | POA: Diagnosis not present

## 2015-04-25 DIAGNOSIS — E785 Hyperlipidemia, unspecified: Secondary | ICD-10-CM | POA: Diagnosis not present

## 2015-05-10 DIAGNOSIS — J40 Bronchitis, not specified as acute or chronic: Secondary | ICD-10-CM | POA: Diagnosis not present

## 2015-07-22 DIAGNOSIS — E785 Hyperlipidemia, unspecified: Secondary | ICD-10-CM | POA: Diagnosis not present

## 2015-07-22 DIAGNOSIS — E119 Type 2 diabetes mellitus without complications: Secondary | ICD-10-CM | POA: Diagnosis not present

## 2015-07-22 DIAGNOSIS — E559 Vitamin D deficiency, unspecified: Secondary | ICD-10-CM | POA: Diagnosis not present

## 2015-07-22 DIAGNOSIS — I10 Essential (primary) hypertension: Secondary | ICD-10-CM | POA: Diagnosis not present

## 2015-07-22 DIAGNOSIS — J45909 Unspecified asthma, uncomplicated: Secondary | ICD-10-CM | POA: Diagnosis not present

## 2015-10-24 DIAGNOSIS — E785 Hyperlipidemia, unspecified: Secondary | ICD-10-CM | POA: Diagnosis not present

## 2015-10-24 DIAGNOSIS — Z125 Encounter for screening for malignant neoplasm of prostate: Secondary | ICD-10-CM | POA: Diagnosis not present

## 2015-10-24 DIAGNOSIS — E119 Type 2 diabetes mellitus without complications: Secondary | ICD-10-CM | POA: Diagnosis not present

## 2015-10-24 DIAGNOSIS — J45909 Unspecified asthma, uncomplicated: Secondary | ICD-10-CM | POA: Diagnosis not present

## 2015-10-24 DIAGNOSIS — I10 Essential (primary) hypertension: Secondary | ICD-10-CM | POA: Diagnosis not present

## 2015-10-24 DIAGNOSIS — Z79899 Other long term (current) drug therapy: Secondary | ICD-10-CM | POA: Diagnosis not present

## 2015-10-24 DIAGNOSIS — E559 Vitamin D deficiency, unspecified: Secondary | ICD-10-CM | POA: Diagnosis not present

## 2016-01-24 DIAGNOSIS — I10 Essential (primary) hypertension: Secondary | ICD-10-CM | POA: Diagnosis not present

## 2016-01-24 DIAGNOSIS — J45909 Unspecified asthma, uncomplicated: Secondary | ICD-10-CM | POA: Diagnosis not present

## 2016-01-24 DIAGNOSIS — E119 Type 2 diabetes mellitus without complications: Secondary | ICD-10-CM | POA: Diagnosis not present

## 2016-01-24 DIAGNOSIS — Z23 Encounter for immunization: Secondary | ICD-10-CM | POA: Diagnosis not present

## 2016-01-24 DIAGNOSIS — E559 Vitamin D deficiency, unspecified: Secondary | ICD-10-CM | POA: Diagnosis not present

## 2016-01-24 DIAGNOSIS — E785 Hyperlipidemia, unspecified: Secondary | ICD-10-CM | POA: Diagnosis not present

## 2016-05-09 DIAGNOSIS — E119 Type 2 diabetes mellitus without complications: Secondary | ICD-10-CM | POA: Diagnosis not present

## 2016-05-09 DIAGNOSIS — L309 Dermatitis, unspecified: Secondary | ICD-10-CM | POA: Diagnosis not present

## 2016-05-09 DIAGNOSIS — Z79899 Other long term (current) drug therapy: Secondary | ICD-10-CM | POA: Diagnosis not present

## 2016-05-09 DIAGNOSIS — I10 Essential (primary) hypertension: Secondary | ICD-10-CM | POA: Diagnosis not present

## 2016-05-09 DIAGNOSIS — E785 Hyperlipidemia, unspecified: Secondary | ICD-10-CM | POA: Diagnosis not present

## 2016-08-07 DIAGNOSIS — E119 Type 2 diabetes mellitus without complications: Secondary | ICD-10-CM | POA: Diagnosis not present

## 2016-08-07 DIAGNOSIS — I10 Essential (primary) hypertension: Secondary | ICD-10-CM | POA: Diagnosis not present

## 2016-08-07 DIAGNOSIS — Z125 Encounter for screening for malignant neoplasm of prostate: Secondary | ICD-10-CM | POA: Diagnosis not present

## 2016-08-07 DIAGNOSIS — E785 Hyperlipidemia, unspecified: Secondary | ICD-10-CM | POA: Diagnosis not present

## 2016-08-07 DIAGNOSIS — Z79899 Other long term (current) drug therapy: Secondary | ICD-10-CM | POA: Diagnosis not present

## 2016-12-05 DIAGNOSIS — E119 Type 2 diabetes mellitus without complications: Secondary | ICD-10-CM | POA: Diagnosis not present

## 2016-12-05 DIAGNOSIS — Z79899 Other long term (current) drug therapy: Secondary | ICD-10-CM | POA: Diagnosis not present

## 2016-12-05 DIAGNOSIS — E785 Hyperlipidemia, unspecified: Secondary | ICD-10-CM | POA: Diagnosis not present

## 2016-12-05 DIAGNOSIS — I10 Essential (primary) hypertension: Secondary | ICD-10-CM | POA: Diagnosis not present

## 2016-12-05 DIAGNOSIS — R0982 Postnasal drip: Secondary | ICD-10-CM | POA: Diagnosis not present

## 2016-12-13 DIAGNOSIS — E1165 Type 2 diabetes mellitus with hyperglycemia: Secondary | ICD-10-CM | POA: Diagnosis not present

## 2017-01-14 DIAGNOSIS — T383X5D Adverse effect of insulin and oral hypoglycemic [antidiabetic] drugs, subsequent encounter: Secondary | ICD-10-CM | POA: Diagnosis not present

## 2017-04-29 DIAGNOSIS — E559 Vitamin D deficiency, unspecified: Secondary | ICD-10-CM | POA: Diagnosis not present

## 2017-04-29 DIAGNOSIS — Z125 Encounter for screening for malignant neoplasm of prostate: Secondary | ICD-10-CM | POA: Diagnosis not present

## 2017-04-29 DIAGNOSIS — E785 Hyperlipidemia, unspecified: Secondary | ICD-10-CM | POA: Diagnosis not present

## 2017-04-29 DIAGNOSIS — Z7984 Long term (current) use of oral hypoglycemic drugs: Secondary | ICD-10-CM | POA: Diagnosis not present

## 2017-04-29 DIAGNOSIS — I10 Essential (primary) hypertension: Secondary | ICD-10-CM | POA: Diagnosis not present

## 2017-04-29 DIAGNOSIS — E119 Type 2 diabetes mellitus without complications: Secondary | ICD-10-CM | POA: Diagnosis not present

## 2017-04-29 DIAGNOSIS — L309 Dermatitis, unspecified: Secondary | ICD-10-CM | POA: Diagnosis not present

## 2017-04-29 DIAGNOSIS — Z79899 Other long term (current) drug therapy: Secondary | ICD-10-CM | POA: Diagnosis not present

## 2017-07-22 DIAGNOSIS — E785 Hyperlipidemia, unspecified: Secondary | ICD-10-CM | POA: Diagnosis not present

## 2017-07-22 DIAGNOSIS — L309 Dermatitis, unspecified: Secondary | ICD-10-CM | POA: Diagnosis not present

## 2017-07-22 DIAGNOSIS — E559 Vitamin D deficiency, unspecified: Secondary | ICD-10-CM | POA: Diagnosis not present

## 2017-07-22 DIAGNOSIS — Z79899 Other long term (current) drug therapy: Secondary | ICD-10-CM | POA: Diagnosis not present

## 2017-07-22 DIAGNOSIS — E119 Type 2 diabetes mellitus without complications: Secondary | ICD-10-CM | POA: Diagnosis not present

## 2017-07-22 DIAGNOSIS — Z125 Encounter for screening for malignant neoplasm of prostate: Secondary | ICD-10-CM | POA: Diagnosis not present

## 2017-07-22 DIAGNOSIS — I10 Essential (primary) hypertension: Secondary | ICD-10-CM | POA: Diagnosis not present

## 2017-09-25 DIAGNOSIS — S90822A Blister (nonthermal), left foot, initial encounter: Secondary | ICD-10-CM | POA: Diagnosis not present

## 2017-10-21 DIAGNOSIS — I10 Essential (primary) hypertension: Secondary | ICD-10-CM | POA: Diagnosis not present

## 2017-10-21 DIAGNOSIS — Z79899 Other long term (current) drug therapy: Secondary | ICD-10-CM | POA: Diagnosis not present

## 2017-10-21 DIAGNOSIS — L309 Dermatitis, unspecified: Secondary | ICD-10-CM | POA: Diagnosis not present

## 2017-10-21 DIAGNOSIS — E119 Type 2 diabetes mellitus without complications: Secondary | ICD-10-CM | POA: Diagnosis not present

## 2017-10-21 DIAGNOSIS — E785 Hyperlipidemia, unspecified: Secondary | ICD-10-CM | POA: Diagnosis not present

## 2017-10-21 DIAGNOSIS — E1165 Type 2 diabetes mellitus with hyperglycemia: Secondary | ICD-10-CM | POA: Diagnosis not present

## 2018-01-23 DIAGNOSIS — E785 Hyperlipidemia, unspecified: Secondary | ICD-10-CM | POA: Diagnosis not present

## 2018-01-23 DIAGNOSIS — E559 Vitamin D deficiency, unspecified: Secondary | ICD-10-CM | POA: Diagnosis not present

## 2018-01-23 DIAGNOSIS — I10 Essential (primary) hypertension: Secondary | ICD-10-CM | POA: Diagnosis not present

## 2018-01-23 DIAGNOSIS — E119 Type 2 diabetes mellitus without complications: Secondary | ICD-10-CM | POA: Diagnosis not present

## 2018-05-05 DIAGNOSIS — E119 Type 2 diabetes mellitus without complications: Secondary | ICD-10-CM | POA: Diagnosis not present

## 2018-05-05 DIAGNOSIS — I10 Essential (primary) hypertension: Secondary | ICD-10-CM | POA: Diagnosis not present

## 2018-05-05 DIAGNOSIS — E559 Vitamin D deficiency, unspecified: Secondary | ICD-10-CM | POA: Diagnosis not present

## 2018-05-05 DIAGNOSIS — E785 Hyperlipidemia, unspecified: Secondary | ICD-10-CM | POA: Diagnosis not present

## 2018-05-06 DIAGNOSIS — E119 Type 2 diabetes mellitus without complications: Secondary | ICD-10-CM | POA: Diagnosis not present

## 2018-05-06 DIAGNOSIS — E559 Vitamin D deficiency, unspecified: Secondary | ICD-10-CM | POA: Diagnosis not present

## 2018-05-06 DIAGNOSIS — E785 Hyperlipidemia, unspecified: Secondary | ICD-10-CM | POA: Diagnosis not present

## 2018-08-05 DIAGNOSIS — E559 Vitamin D deficiency, unspecified: Secondary | ICD-10-CM | POA: Diagnosis not present

## 2018-08-05 DIAGNOSIS — J329 Chronic sinusitis, unspecified: Secondary | ICD-10-CM | POA: Diagnosis not present

## 2018-08-05 DIAGNOSIS — E119 Type 2 diabetes mellitus without complications: Secondary | ICD-10-CM | POA: Diagnosis not present

## 2018-08-05 DIAGNOSIS — I10 Essential (primary) hypertension: Secondary | ICD-10-CM | POA: Diagnosis not present

## 2018-10-23 DIAGNOSIS — E559 Vitamin D deficiency, unspecified: Secondary | ICD-10-CM | POA: Diagnosis not present

## 2018-10-23 DIAGNOSIS — I10 Essential (primary) hypertension: Secondary | ICD-10-CM | POA: Diagnosis not present

## 2018-10-23 DIAGNOSIS — E119 Type 2 diabetes mellitus without complications: Secondary | ICD-10-CM | POA: Diagnosis not present
